# Patient Record
Sex: Male | Born: 1975 | Race: White | Hispanic: No | Marital: Single | Smoking: Current some day smoker
Health system: Southern US, Community
[De-identification: ages and names within clinical notes are randomized; demographics above are authoritative.]

---

## 2017-02-17 ENCOUNTER — Emergency Department (HOSPITAL_COMMUNITY): Payer: Federal, State, Local not specified - PPO

## 2017-02-17 ENCOUNTER — Encounter (HOSPITAL_COMMUNITY): Payer: Self-pay | Admitting: *Deleted

## 2017-02-17 ENCOUNTER — Emergency Department (HOSPITAL_COMMUNITY)
Admission: EM | Admit: 2017-02-17 | Discharge: 2017-02-17 | Disposition: A | Discharge status: Home / Self Care | Payer: Federal, State, Local not specified - PPO | Attending: Emergency Medicine | Admitting: Emergency Medicine

## 2017-02-17 DIAGNOSIS — Y929 Unspecified place or not applicable: Secondary | ICD-10-CM | POA: Insufficient documentation

## 2017-02-17 DIAGNOSIS — S82831A Other fracture of upper and lower end of right fibula, initial encounter for closed fracture: Secondary | ICD-10-CM | POA: Diagnosis not present

## 2017-02-17 DIAGNOSIS — F17200 Nicotine dependence, unspecified, uncomplicated: Secondary | ICD-10-CM | POA: Insufficient documentation

## 2017-02-17 DIAGNOSIS — Y9302 Activity, running: Secondary | ICD-10-CM | POA: Diagnosis not present

## 2017-02-17 DIAGNOSIS — Y999 Unspecified external cause status: Secondary | ICD-10-CM | POA: Insufficient documentation

## 2017-02-17 DIAGNOSIS — S8991XA Unspecified injury of right lower leg, initial encounter: Secondary | ICD-10-CM | POA: Diagnosis present

## 2017-02-17 DIAGNOSIS — X509XXA Other and unspecified overexertion or strenuous movements or postures, initial encounter: Secondary | ICD-10-CM | POA: Insufficient documentation

## 2017-02-17 MED ORDER — OXYCODONE-ACETAMINOPHEN 5-325 MG PO TABS: 1.0000 | ORAL_TABLET | Freq: Four times a day (QID) | ORAL | 0 refills | Status: AC | PRN

## 2017-02-17 MED ORDER — OXYCODONE-ACETAMINOPHEN 5-325 MG PO TABS
1.0000 | ORAL_TABLET | Freq: Once | ORAL | Status: AC
  Administered 2017-02-17: 1 via ORAL
  Filled 2017-02-17: qty 1

## 2017-02-17 NOTE — ED Triage Notes (Signed)
Pt was playing around and accidentally rolled his R ankle, swelling noted, reports being able to ambulate with increased pain

## 2017-02-17 NOTE — Progress Notes (Signed)
Orthopedic Tech Progress Note Patient Details:  Sean GuadalajaraChristopher Beard 01-31-1976 [id number]  Ortho Devices Type of Ortho Device: Post (short leg) splint Ortho Device/Splint Location: rle Ortho Device/Splint Interventions: Ordered, Application   [name], Tionna Gigante J 02/17/2017, 5:13 AM

## 2017-02-17 NOTE — Discharge Instructions (Signed)
You have a fracture of your ankle.  You need to remain non-weightbearing until follow-up.  Keep elevated and iced.  Call for follow-up in this week.  If you develop numbness, tingling or increased pain you should be re-evaluated.

## 2017-02-17 NOTE — ED Provider Notes (Signed)
[facility]Y DEPT Provider Note   CSN: [id number] Arrival date & time: 02/17/17  0256     History   Chief Complaint Chief Complaint  Patient presents with  . Ankle Pain    HPI Sean Beard is a 41 y.o. male.  HPI  This 41 year old male who presents with right ankle pain. Patient reports that he turned his right ankle while running and "messing around." Rates his pain at 6 out of 10. Denies any numbness or tingling in the foot. Does report alcohol use see evening.  History reviewed. No pertinent past medical history.  There are no active problems to display for this patient.   History reviewed. No pertinent surgical history.     Home Medications    Prior to Admission medications   Medication Sig Start Date End Date Taking? Authorizing Provider  oxyCODONE-acetaminophen (PERCOCET/ROXICET) 5-325 MG tablet Take 1-2 tablets by mouth every 6 (six) hours as needed for severe pain. 02/17/17   Rafeef Lau, Mayer Masker, MD    Family History No family history on file.  Social History Social History  Substance Use Topics  . Smoking status: Current Some Day Smoker  . Smokeless tobacco: Never Used  . Alcohol use Yes     Allergies   Patient has no known allergies.   Review of Systems Review of Systems  Musculoskeletal:       Right ankle pain  Skin: Negative for wound.  Neurological: Negative for weakness and numbness.  All other systems reviewed and are negative.    Physical Exam Updated Vital Signs BP (!) 143/82 (BP Location: Left Arm)   Pulse (!) 102   Temp 99.4 F (37.4 C) (Oral)   Resp 18   Ht 5\' 6"  (1.676 m)   Wt 72.6 kg (160 lb)   SpO2 100%   BMI 25.82 kg/m   Physical Exam  Constitutional: He is oriented to person, place, and time. He appears well-developed and well-nourished. No distress.  HENT:  Head: Normocephalic and atraumatic.  Cardiovascular: Normal rate and regular rhythm.   Pulmonary/Chest: Effort normal. No respiratory distress.    Musculoskeletal:  Swelling noted diffusely around the right ankle, small superficial abrasion noted over the medial malleolus, decreased range of motion, 2+ radial pulse, neurovascularly intact, no proximal fibular tenderness  Neurological: He is alert and oriented to person, place, and time.  Skin: Skin is warm and dry.  Psychiatric: He has a normal mood and affect.  Nursing note and vitals reviewed.    ED Treatments / Results  Labs (all labs ordered are listed, but only abnormal results are displayed) Labs Reviewed - No data to display  EKG  EKG Interpretation None       Radiology Dg Ankle Complete Right  Result Date: 02/17/2017 CLINICAL DATA:  Rolled right ankle, with diffuse right ankle pain. Initial encounter. EXAM: RIGHT ANKLE - COMPLETE 3+ VIEW COMPARISON:  None. FINDINGS: There is an oblique fracture through the distal fibula, with mild posterior displacement. Overlying soft tissue swelling is noted. There is mild medial widening of the ankle mortise, without significant talar tilt or subluxation. The interosseous space is grossly unremarkable. There may be a small posterior malleolar fracture. An os trigonum is noted. The subtalar joint is grossly unremarkable. Anterior soft tissue swelling is noted about the ankle. IMPRESSION: 1. Oblique fracture through the distal fibula, with mild posterior displacement. Mild medial widening of the ankle mortise, without significant talar tilt or subluxation. 2. Question of small posterior malleolar fracture. 3. Os trigonum noted.  Electronically Signed   By: Roanna RaiderJeffery  Chang M.D.   On: 02/17/2017 03:58    Procedures .Splint Application Date/Time: 02/17/2017 7:33 AM Performed by: Shon BatonHORTON, Aislynn Cifelli F Authorized by: Shon BatonHORTON, Jossie Smoot F   Consent:    Consent obtained:  Verbal   Consent given by:  Patient Pre-procedure details:    Sensation:  Normal Procedure details:    Laterality:  Right   Location:  Ankle   Ankle:  R ankle    Strapping: no     Cast type:  Short leg   Splint type:  Short leg   Supplies:  Prefabricated splint and cotton padding Post-procedure details:    Patient tolerance of procedure:  Tolerated well, no immediate complications    (including critical care time)  Medications Ordered in ED Medications  oxyCODONE-acetaminophen (PERCOCET/ROXICET) 5-325 MG per tablet 1 tablet (1 tablet Oral Given 02/17/17 0436)     Initial Impression / Assessment and Plan / ED Course  I have reviewed the triage vital signs and the nursing notes.  Pertinent labs & imaging results that were available during my care of the patient were reviewed by me and considered in my medical decision making (see chart for details).     Patient presents with injury to right ankle.  He has evidence of a distal fibular fracture and potentially a posterior malleolus fracture. He is neurovascularly intact. It does not appear open.  Patient was placed in splint. Nonweightbearing. Orthopedic follow-up.  After history, exam, and medical workup I feel the patient has been appropriately medically screened and is safe for discharge home. Pertinent diagnoses were discussed with the patient. Patient was given return precautions.   Final Clinical Impressions(s) / ED Diagnoses   Final diagnoses:  Closed avulsion fracture of distal end of right fibula, initial encounter    New Prescriptions Discharge Medication List as of 02/17/2017  4:52 AM    START taking these medications   Details  oxyCODONE-acetaminophen (PERCOCET/ROXICET) 5-325 MG tablet Take 1-2 tablets by mouth every 6 (six) hours as needed for severe pain., Starting Tue 02/17/2017, Print         Lelania Bia, Mayer Maskerourtney F, MD 02/17/17 [phone]

## 2017-03-03 HISTORY — PX: ANKLE SURGERY: SHX546

## 2017-04-26 ENCOUNTER — Encounter (HOSPITAL_COMMUNITY): Payer: Self-pay

## 2017-04-26 ENCOUNTER — Emergency Department (HOSPITAL_COMMUNITY)
Admission: EM | Admit: 2017-04-26 | Discharge: 2017-04-26 | Disposition: A | Discharge status: Home / Self Care | Payer: Federal, State, Local not specified - PPO | Attending: Emergency Medicine | Admitting: Emergency Medicine

## 2017-04-26 ENCOUNTER — Emergency Department (HOSPITAL_COMMUNITY): Payer: Federal, State, Local not specified - PPO

## 2017-04-26 DIAGNOSIS — Y9389 Activity, other specified: Secondary | ICD-10-CM | POA: Diagnosis not present

## 2017-04-26 DIAGNOSIS — W2209XA Striking against other stationary object, initial encounter: Secondary | ICD-10-CM | POA: Diagnosis not present

## 2017-04-26 DIAGNOSIS — Y998 Other external cause status: Secondary | ICD-10-CM | POA: Insufficient documentation

## 2017-04-26 DIAGNOSIS — Y92009 Unspecified place in unspecified non-institutional (private) residence as the place of occurrence of the external cause: Secondary | ICD-10-CM | POA: Diagnosis not present

## 2017-04-26 DIAGNOSIS — S99921A Unspecified injury of right foot, initial encounter: Secondary | ICD-10-CM | POA: Diagnosis present

## 2017-04-26 DIAGNOSIS — F17200 Nicotine dependence, unspecified, uncomplicated: Secondary | ICD-10-CM | POA: Insufficient documentation

## 2017-04-26 DIAGNOSIS — S92911A Unspecified fracture of right toe(s), initial encounter for closed fracture: Secondary | ICD-10-CM

## 2017-04-26 DIAGNOSIS — S92511A Displaced fracture of proximal phalanx of right lesser toe(s), initial encounter for closed fracture: Secondary | ICD-10-CM | POA: Diagnosis not present

## 2017-04-26 MED ORDER — BUPIVACAINE HCL (PF) 0.5 % IJ SOLN
10.0000 mL | Freq: Once | INTRAMUSCULAR | Status: AC
  Administered 2017-04-26: 10 mL

## 2017-04-26 MED ORDER — HYDROCODONE-ACETAMINOPHEN 5-325 MG PO TABS
1.0000 | ORAL_TABLET | Freq: Once | ORAL | Status: AC
  Administered 2017-04-26: 1 via ORAL
  Filled 2017-04-26: qty 1

## 2017-04-26 MED ORDER — IBUPROFEN 800 MG PO TABS: 800.0000 mg | ORAL_TABLET | Freq: Three times a day (TID) | ORAL | 0 refills | Status: AC

## 2017-04-26 MED ORDER — HYDROCODONE-ACETAMINOPHEN 5-325 MG PO TABS: 1.0000 | ORAL_TABLET | ORAL | 0 refills | Status: AC | PRN

## 2017-04-26 MED ORDER — BUPIVACAINE HCL (PF) 0.25 % IJ SOLN: 50.0000 mL | Freq: Once | INTRAMUSCULAR | Status: DC

## 2017-04-26 MED ORDER — IBUPROFEN 800 MG PO TABS
800.0000 mg | ORAL_TABLET | Freq: Once | ORAL | Status: AC
  Administered 2017-04-26: 800 mg via ORAL
  Filled 2017-04-26: qty 1

## 2017-04-26 NOTE — ED Triage Notes (Signed)
Pt had recent ankle surgery and got up to go to the bathroom and the crutches went out from under him. He felt a pop and his right 2nd toe is possibly broke.

## 2017-04-26 NOTE — ED Notes (Signed)
Pt returned from X Ray.

## 2017-04-26 NOTE — ED Provider Notes (Signed)
[facility] Provider Note   CSN: [id number] Arrival date & time: 04/26/17  4098     History   Chief Complaint Chief Complaint  Patient presents with  . Foot Injury    HPI Sean Beard is a 41 y.o. male. CC:  Foot injury  HPI:  41 year old male. Had surgery on his ankle for fracture 6 weeks ago. Was given some Liberty with weightbearing and placed in a boot a week ago. Is to walk with a blue for 3 weeks. Still uses crutches at times. He awakened this morning from sleep. He was not wearing his boot. He was walking with crutches to the bathroom. He slipped and struck his right foot against a stationary object. He has a deformity of the second toe and pain through his foot. He also states that he struck his toe in a similar fashion several days ago to his right great toe has been painful as well.  History reviewed. No pertinent past medical history.  There are no active problems to display for this patient.   Past Surgical History:  Procedure Laterality Date  . ANKLE SURGERY Right 03/03/2017       Home Medications    Prior to Admission medications   Medication Sig Start Date End Date Taking? Authorizing Provider  oxyCODONE-acetaminophen (PERCOCET/ROXICET) 5-325 MG tablet Take 1-2 tablets by mouth every 6 (six) hours as needed for severe pain. 02/17/17   Horton, Mayer Masker, MD    Family History No family history on file.  Social History Social History  Substance Use Topics  . Smoking status: Current Some Day Smoker  . Smokeless tobacco: Never Used  . Alcohol use Yes     Allergies   Patient has no known allergies.   Review of Systems Review of Systems  Constitutional: Negative for appetite change, chills, diaphoresis, fatigue and fever.  HENT: Negative for mouth sores, sore throat and trouble swallowing.   Eyes: Negative for visual disturbance.  Respiratory: Negative for cough, chest tightness, shortness of breath and wheezing.   Cardiovascular:  Negative for chest pain.  Gastrointestinal: Negative for abdominal distention, abdominal pain, diarrhea, nausea and vomiting.  Endocrine: Negative for polydipsia, polyphagia and polyuria.  Genitourinary: Negative for dysuria, frequency and hematuria.  Musculoskeletal: Negative for gait problem.       Right foot pain  Skin: Negative for color change, pallor and rash.  Neurological: Negative for dizziness, syncope, light-headedness and headaches.  Hematological: Does not bruise/bleed easily.  Psychiatric/Behavioral: Negative for behavioral problems and confusion.     Physical Exam Updated Vital Signs BP 129/89   Pulse 100   Temp 97.9 F (36.6 C) (Oral)   Resp 18   Ht 5\' 6"  (1.676 m)   Wt 72.6 kg (160 lb)   SpO2 100%   BMI 25.82 kg/m   Physical Exam  Constitutional: He is oriented to person, place, and time. He appears well-developed and well-nourished. No distress.  HENT:  Head: Normocephalic.  Eyes: Pupils are equal, round, and reactive to light. Conjunctivae are normal. No scleral icterus.  Neck: Normal range of motion. Neck supple. No thyromegaly present.  Cardiovascular: Normal rate and regular rhythm.  Exam reveals no gallop and no friction rub.   No murmur heard. Pulmonary/Chest: Effort normal and breath sounds normal. No respiratory distress. He has no wheezes. He has no rales.  Abdominal: Soft. Bowel sounds are normal. He exhibits no distension. There is no tenderness. There is no rebound.  Musculoskeletal: Normal range of motion.  Lateral deformity  of the right second toe. Tenderness about the MCP joints. Soft tissues were diffusely around the ankle and throughout the forefoot consistent with patient's recent surgery. Medial small incision enlarged lateral incision both appear quite intact.  Neurological: He is alert and oriented to person, place, and time.  Skin: Skin is warm and dry. No rash noted.  Psychiatric: He has a normal mood and affect. His behavior is normal.      ED Treatments / Results  Labs (all labs ordered are listed, but only abnormal results are displayed) Labs Reviewed - No data to display  EKG  EKG Interpretation None       Radiology Dg Ankle Complete Right  Result Date: 04/26/2017 CLINICAL DATA:  Status post fall, with swelling and discoloration about the right ankle. Initial encounter. EXAM: RIGHT ANKLE - COMPLETE 3+ VIEW COMPARISON:  Right ankle radiographs performed 02/17/2017 FINDINGS: There is no evidence of fracture or dislocation at the ankle. Hardware along the distal tibia and fibula appears grossly intact, without evidence of loosening. There is mild disuse osteopenia about the ankle. The ankle mortise is grossly unremarkable in appearance. The subtalar joint is grossly unremarkable. Mild anterior soft tissue swelling is noted at the ankle. IMPRESSION: 1. No evidence of fracture or dislocation. 2. Hardware along the distal tibia and fibula appears grossly intact. 3. Mild disuse osteopenia about the ankle. Electronically Signed   By: Roanna Raider M.D.   On: 04/26/2017 06:05   Dg Foot Complete Right  Result Date: 04/26/2017 CLINICAL DATA:  Status post fall, with right second toe deformity. Initial encounter. EXAM: RIGHT FOOT COMPLETE - 3+ VIEW COMPARISON:  None. FINDINGS: There is a mildly displaced fracture through the proximal aspect of the second proximal phalanx, with lateral angulation. There also minimally displaced fractures of the bases of the third, fourth and fifth proximal phalanges. Hardware at the distal tibia and fibula appears grossly intact. The joint spaces are preserved. There is no evidence of talar subluxation; the subtalar joint is unremarkable in appearance. Dorsal soft tissue swelling is noted at the forefoot. IMPRESSION: 1. Mildly displaced fracture through the proximal aspect of the second proximal phalanx, with lateral angulation. 2. Minimally displaced fractures of the bases of the third, fourth and  fifth proximal phalanges. Electronically Signed   By: Roanna Raider M.D.   On: 04/26/2017 06:04    Procedures Procedures (including critical care time)  Medications Ordered in ED Medications  bupivacaine (PF) (MARCAINE) 0.25 % injection 50 mL (not administered)     Initial Impression / Assessment and Plan / ED Course  I have reviewed the triage vital signs and the nursing notes.  Pertinent labs & imaging results that were available during my care of the patient were reviewed by me and considered in my medical decision making (see chart for details).   patient underwent a metacarpal block of the second digit with Marcaine. Toe was reduced and buddy taped. he did walk with his deltoid boot. Follow up with his orthopedist.  Final Clinical Impressions(s) / ED Diagnoses   Final diagnoses:  Closed displaced fracture of phalanx of toe of right foot, unspecified toe, initial encounter    New Prescriptions New Prescriptions   No medications on file     Rolland Porter, MD 04/26/17 [phone]

## 2017-04-26 NOTE — ED Notes (Signed)
Pt taken to XRay 

## 2017-10-06 IMAGING — CR DG FOOT COMPLETE 3+V*R*
3 series · 3 of 3 positions shown · non-contrast
Comparison: None.

CLINICAL DATA: Status post fall, with right second toe deformity.
Initial encounter.

EXAM:
RIGHT FOOT COMPLETE - 3+ VIEW

[foot ap]
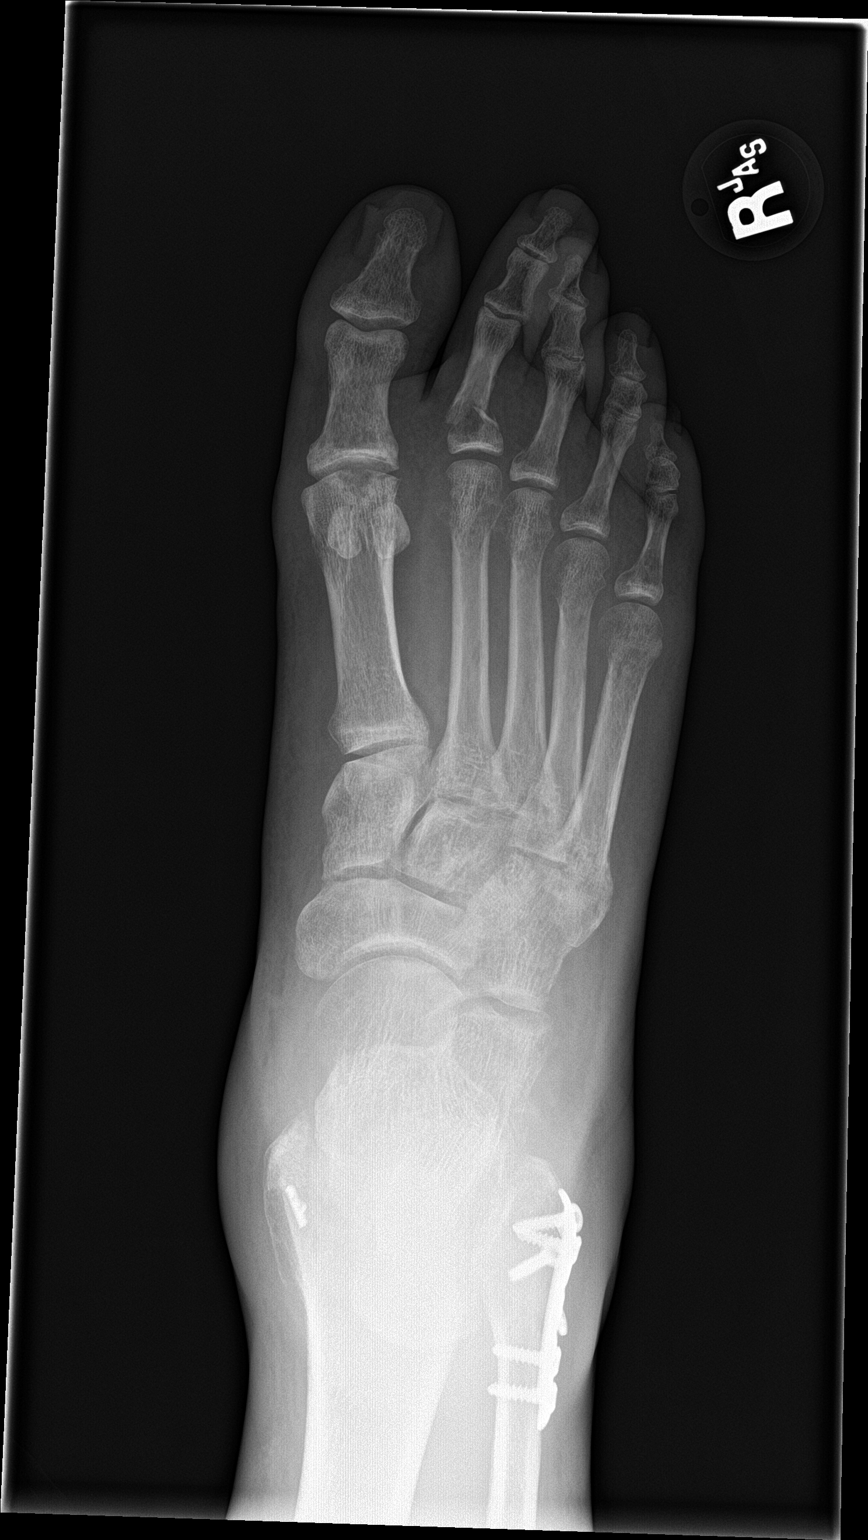

[foot obl]
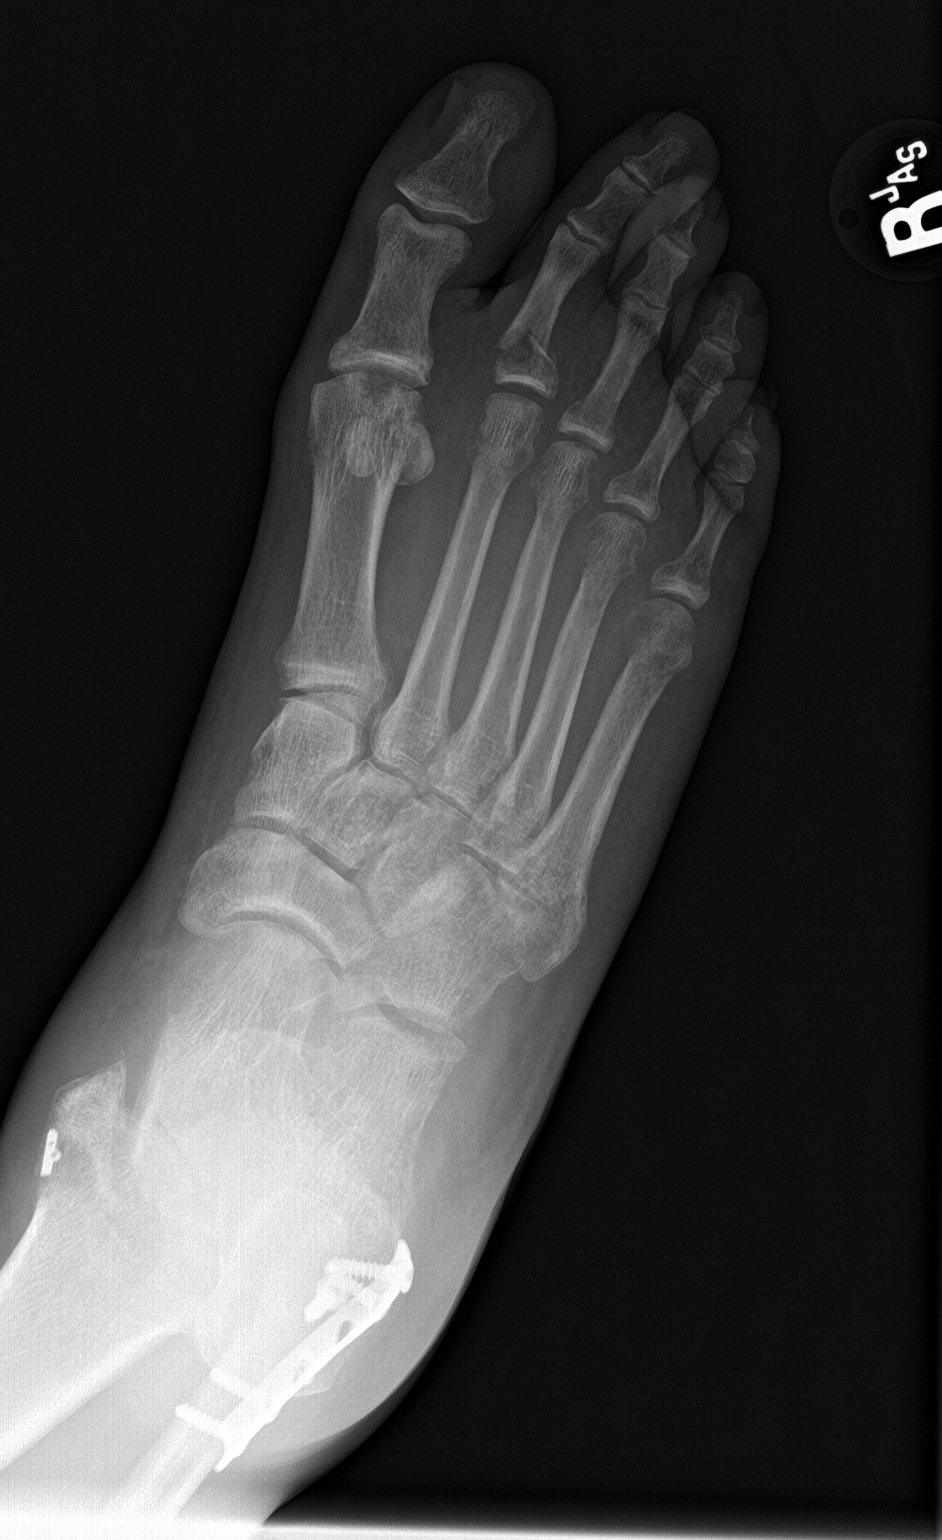

[foot lat]
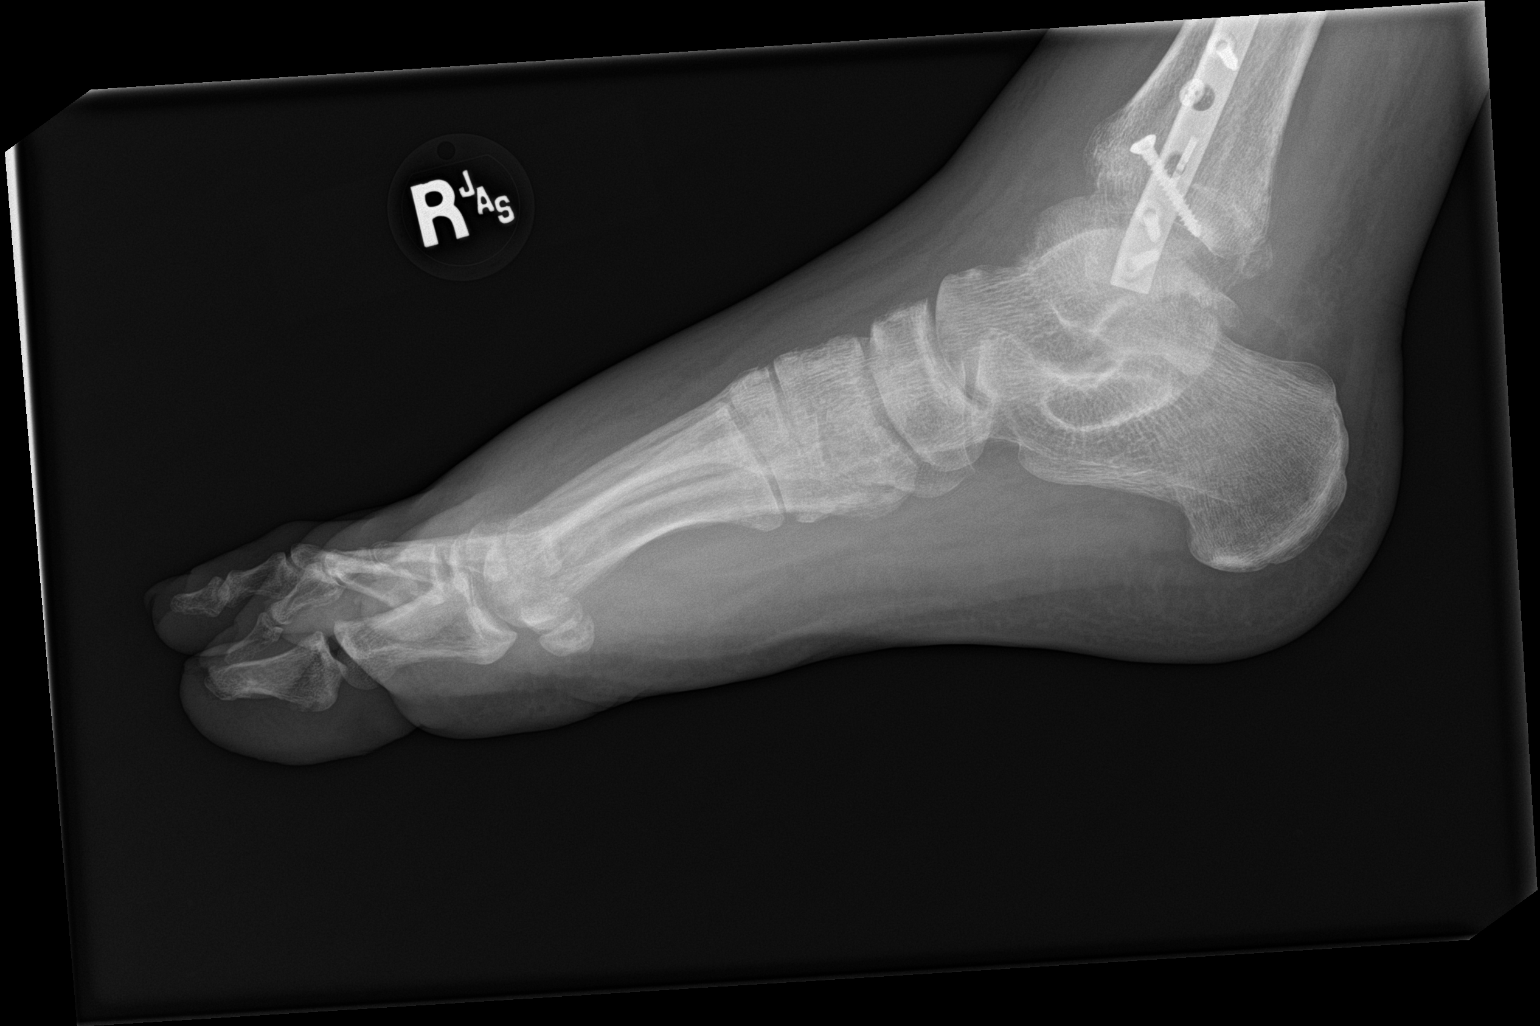

[3 of 3 positions shown; findings below may reference images not displayed]

FINDINGS: There is a mildly displaced fracture through the proximal aspect of
the second proximal phalanx, with lateral angulation. There also
minimally displaced fractures of the bases of the third, fourth and
fifth proximal phalanges.

Hardware at the distal tibia and fibula appears grossly intact. The
joint spaces are preserved. There is no evidence of talar
subluxation; the subtalar joint is unremarkable in appearance.

Dorsal soft tissue swelling is noted at the forefoot.
IMPRESSION: 1. Mildly displaced fracture through the proximal aspect of the
second proximal phalanx, with lateral angulation.
2. Minimally displaced fractures of the bases of the third, fourth
and fifth proximal phalanges.

## 2019-12-08 ENCOUNTER — Ambulatory Visit: Payer: Federal, State, Local not specified - PPO | Attending: Internal Medicine

## 2019-12-08 DIAGNOSIS — Z23 Encounter for immunization: Secondary | ICD-10-CM

## 2019-12-08 NOTE — Progress Notes (Signed)
   Covid-19 Vaccination Clinic  Name:  Sean Beard    MRN: [id number] DOB: Apr 15, 1976  12/08/2019  Mr. Sean Beard was observed post Covid-19 immunization for 15 minutes without incident. He was provided with Vaccine Information Sheet and instruction to access the V-Safe system.   Mr. Sean Beard was instructed to call 911 with any severe reactions post vaccine: Marland Kitchen Difficulty breathing  . Swelling of face and throat  . A fast heartbeat  . A bad rash all over body  . Dizziness and weakness   Immunizations Administered    Name Date Dose VIS Date Route   Pfizer COVID-19 Vaccine 12/08/2019 12:23 PM 0.3 mL 08/19/2019 Intramuscular   Manufacturer: [employer], [employer]   Lot: [id number]   NDC: [address]-4

## 2020-01-02 ENCOUNTER — Ambulatory Visit: Payer: Federal, State, Local not specified - PPO | Attending: Internal Medicine

## 2020-01-02 DIAGNOSIS — Z23 Encounter for immunization: Secondary | ICD-10-CM

## 2020-01-02 NOTE — Progress Notes (Signed)
   Covid-19 Vaccination Clinic  Name:  Sean Beard    MRN: [id number] DOB: 03-Feb-1976  01/02/2020  [name] was observed post Covid-19 immunization for 15 minutes without incident. He was provided with Vaccine Information Sheet and instruction to access the V-Safe system.   [name] was instructed to call 911 with any severe reactions post vaccine: Marland Kitchen Difficulty breathing  . Swelling of face and throat  . A fast heartbeat  . A bad rash all over body  . Dizziness and weakness   Immunizations Administered    Name Date Dose VIS Date Route   Pfizer COVID-19 Vaccine 01/02/2020  1:18 PM 0.3 mL 11/02/2018 Intramuscular   Manufacturer: [employer], [employer]   Lot: [id number]   NDC: [address]-0
